# Patient Record
Sex: Female | Born: 1945 | Race: White | Hispanic: No | Marital: Married | Smoking: Never smoker
Health system: Southern US, Community
[De-identification: ages and names within clinical notes are randomized; demographics above are authoritative.]

## PROBLEM LIST (undated history)

## (undated) DIAGNOSIS — F32A Depression, unspecified: Secondary | ICD-10-CM

## (undated) DIAGNOSIS — F0390 Unspecified dementia without behavioral disturbance: Secondary | ICD-10-CM

## (undated) DIAGNOSIS — F329 Major depressive disorder, single episode, unspecified: Secondary | ICD-10-CM

## (undated) DIAGNOSIS — F03A0 Unspecified dementia, mild, without behavioral disturbance, psychotic disturbance, mood disturbance, and anxiety: Secondary | ICD-10-CM

## (undated) HISTORY — DX: Major depressive disorder, single episode, unspecified: F32.9

## (undated) HISTORY — DX: Unspecified dementia without behavioral disturbance: F03.90

## (undated) HISTORY — DX: Unspecified dementia, mild, without behavioral disturbance, psychotic disturbance, mood disturbance, and anxiety: F03.A0

## (undated) HISTORY — DX: Depression, unspecified: F32.A

## (undated) HISTORY — PX: OTHER SURGICAL HISTORY: SHX169

---

## 2009-10-01 ENCOUNTER — Emergency Department: Payer: Self-pay | Admitting: Emergency Medicine

## 2010-01-27 ENCOUNTER — Ambulatory Visit (HOSPITAL_COMMUNITY): Admission: RE | Admit: 2010-01-27 | Discharge: 2010-01-27 | Payer: Self-pay | Admitting: Family Medicine

## 2012-07-26 ENCOUNTER — Encounter: Payer: Self-pay | Admitting: Gastroenterology

## 2012-07-26 ENCOUNTER — Ambulatory Visit (INDEPENDENT_AMBULATORY_CARE_PROVIDER_SITE_OTHER): Payer: Medicare Other | Admitting: Gastroenterology

## 2012-07-26 ENCOUNTER — Other Ambulatory Visit: Payer: Self-pay

## 2012-07-26 VITALS — BP 100/60 | HR 62 | Temp 98.3°F | Ht 60.0 in | Wt 147.4 lb

## 2012-07-26 DIAGNOSIS — R1310 Dysphagia, unspecified: Secondary | ICD-10-CM

## 2012-07-26 DIAGNOSIS — R160 Hepatomegaly, not elsewhere classified: Secondary | ICD-10-CM

## 2012-07-26 NOTE — Assessment & Plan Note (Signed)
On exam, question hepatomegaly. Proceed with [place].

## 2012-07-26 NOTE — Progress Notes (Signed)
Primary Care Physician:  Rush Barer, PA Primary Gastroenterologist:  Dr. Darrick Penna   Chief Complaint  Patient presents with  . Dysphagia    HPI:   66 year old female who presents with dysphagia. Hx of mild dementia. Pleasantly confused, husband present. States year is 1930 but is oriented to person, situation, place. Reports 1-2 years of issues eating in public, ok at home. Notes choking at restaurants, vomiting. Husband has never seen her get choked at home. Solid food issue. +odynophagia at times. Chronic coughing. Started on Omeprazole yesterday. No nausea, no lack of appetite. No abdominal pain. No melena, no hematochezia. No prior colonoscopy. No change in bowel habits. Points to right below suprasternal notch.   Past Medical History  Diagnosis Date  . Depression   . Mild dementia     ? Alzheimer's    Past Surgical History  Procedure Date  . None     Current Outpatient Prescriptions  Medication Sig Dispense Refill  . azithromycin (ZITHROMAX) 250 MG tablet       . chlorpheniramine-HYDROcodone (TUSSIONEX) 10-8 MG/5ML LQCR Take 5 mLs by mouth every 12 (twelve) hours.       . citalopram (CELEXA) 10 MG tablet Take 10 mg by mouth daily.       Marland Kitchen donepezil (ARICEPT) 10 MG tablet Take 10 mg by mouth at bedtime as needed.       Marland Kitchen omeprazole (PRILOSEC) 20 MG capsule Take 20 mg by mouth daily.       . predniSONE (DELTASONE) 10 MG tablet Take 10 mg by mouth daily.       . vitamin B-12 (CYANOCOBALAMIN) 1000 MCG tablet Take 1,000 mcg by mouth 2 (two) times daily.        Allergies as of 07/26/2012  . (No Known Allergies)    Family History  Problem Relation Age of Onset  . Colon cancer Neg Hx     History   Social History  . Marital Status: Married    Spouse Name: N/A    Number of Children: N/A  . Years of Education: N/A   Occupational History  . retired     Production designer, theatre/television/film at [employer]   Social History Main Topics  . Smoking status: Never Smoker   . Smokeless tobacco:  Not on file  . Alcohol Use: No  . Drug Use: No  . Sexually Active: Not on file   Other Topics Concern  . Not on file   Social History Narrative  . No narrative on file    Review of Systems: Gen: Denies any fever, chills, fatigue, weight loss, lack of appetite.  CV: Denies chest pain, heart palpitations, peripheral edema, syncope.  Resp: Denies shortness of breath at rest or with exertion. Denies wheezing or cough.  GI: SEE HPI GU : Denies urinary burning, urinary frequency, urinary hesitancy MS: Denies joint pain, muscle weakness, cramps, or limitation of movement.  Derm: Denies rash, itching, dry skin Psych: Denies depression, anxiety, memory loss, and confusion Heme: Denies bruising, bleeding, and enlarged lymph nodes.  Physical Exam: BP 100/60  Pulse 62  Temp 98.3 F (36.8 C) (Temporal)  Ht 5' (1.524 m)  Wt 147 lb 6.4 oz (66.86 kg)  BMI 28.79 kg/m2 General:   Alert and oriented to person, place, situation. Not time. Pleasant and cooperative. Well-nourished and well-developed.  Head:  Normocephalic and atraumatic. Eyes:  Without icterus, sclera clear and conjunctiva pink.  Ears:  Normal auditory acuity. Nose:  No deformity, discharge,  or lesions. Mouth:  No  deformity or lesions, oral mucosa pink.  Neck:  Supple, without mass or thyromegaly. Lungs:  Scattered rhonchi, mild expiratory wheeze bilaterally, diminished bases Heart:  S1, S2 present without murmurs appreciated.  Abdomen:  +BS, soft, non-tender and non-distended. Possible hepatomegaly noted. No guarding or rebound. No masses appreciated.  Rectal:  Deferred  Msk:  Symmetrical without gross deformities. Normal posture. Extremities:  Without clubbing or edema. Neurologic:  Alert and  oriented to person, place, situation;  grossly normal neurologically. Skin:  Intact without significant lesions or rashes. Cervical Nodes:  No significant cervical adenopathy. Psych:  Alert and cooperative. Normal mood and  affect.

## 2012-07-26 NOTE — Patient Instructions (Addendum)
We have set you up for an ultrasound of your belly. We will call you with the results.  We have also set you up for an upper endoscopy with Dr. Darrick Penna. Continue to take Prilosec daily.

## 2012-07-26 NOTE — Assessment & Plan Note (Signed)
66 year old female with several year hx of choking spells ONLY in public. Pt points to right below suprasternal notch, stating food "sticks" in this region. +odynophagia at times. Symptoms not completely representing classic dysphagia, and it appears she may be dealing with globus sensation. Question anxiety playing large role. However, needs upper GI evaluation to evaluate for any occult issues. Continue Prilosec as ordered.   Proceed with upper endoscopy and possible dilation in the near future with Dr. Darrick Penna. The risks, benefits, and alternatives have been discussed in detail with patient. They have stated understanding and desire to proceed.  No prior TCS. Pt declines pursuing at this time, despite my discussion regarding the importance of screening for colon cancer. She would rather assess upper GI symptoms first.

## 2012-07-26 NOTE — Progress Notes (Signed)
Pt is set up for [place] on Sept. 26 at 7:45 and she is aware not to eat or drink after mid-night.

## 2012-07-27 NOTE — Progress Notes (Signed)
Faxed to PCP

## 2012-08-02 ENCOUNTER — Ambulatory Visit (HOSPITAL_COMMUNITY)
Admission: RE | Admit: 2012-08-02 | Discharge: 2012-08-02 | Disposition: A | Discharge status: Home / Self Care | Payer: Medicare Other | Source: Ambulatory Visit | Attending: Gastroenterology | Admitting: Gastroenterology

## 2012-08-02 DIAGNOSIS — R932 Abnormal findings on diagnostic imaging of liver and biliary tract: Secondary | ICD-10-CM | POA: Insufficient documentation

## 2012-08-02 DIAGNOSIS — R160 Hepatomegaly, not elsewhere classified: Secondary | ICD-10-CM | POA: Insufficient documentation

## 2012-08-02 NOTE — Progress Notes (Signed)
Quick Note:  [place] without hepatomegaly.  Please let pt know. ______

## 2012-08-03 NOTE — Progress Notes (Signed)
Quick Note:  Called. Many rings and no answer. ______ 

## 2012-08-06 NOTE — Progress Notes (Signed)
Quick Note:  Called and informed pt's husband. ______

## 2012-08-10 ENCOUNTER — Encounter (HOSPITAL_COMMUNITY): Payer: Self-pay | Admitting: *Deleted

## 2012-08-10 ENCOUNTER — Encounter (HOSPITAL_COMMUNITY)
Admission: RE | Disposition: A | Discharge status: Home / Self Care | Payer: Self-pay | Source: Ambulatory Visit | Attending: Gastroenterology

## 2012-08-10 ENCOUNTER — Ambulatory Visit (HOSPITAL_COMMUNITY)
Admission: RE | Admit: 2012-08-10 | Discharge: 2012-08-10 | Disposition: A | Discharge status: Home / Self Care | Payer: Medicare Other | Source: Ambulatory Visit | Attending: Gastroenterology | Admitting: Gastroenterology

## 2012-08-10 DIAGNOSIS — K222 Esophageal obstruction: Secondary | ICD-10-CM

## 2012-08-10 DIAGNOSIS — R1310 Dysphagia, unspecified: Secondary | ICD-10-CM | POA: Insufficient documentation

## 2012-08-10 DIAGNOSIS — K2940 Chronic atrophic gastritis without bleeding: Secondary | ICD-10-CM | POA: Insufficient documentation

## 2012-08-10 DIAGNOSIS — K2970 Gastritis, unspecified, without bleeding: Secondary | ICD-10-CM

## 2012-08-10 DIAGNOSIS — K2990 Gastroduodenitis, unspecified, without bleeding: Secondary | ICD-10-CM

## 2012-08-10 DIAGNOSIS — K2980 Duodenitis without bleeding: Secondary | ICD-10-CM | POA: Insufficient documentation

## 2012-08-10 HISTORY — PX: SAVORY DILATION: SHX5439

## 2012-08-10 HISTORY — PX: ESOPHAGOGASTRODUODENOSCOPY: SHX5428

## 2012-08-10 HISTORY — PX: MALONEY DILATION: SHX5535

## 2012-08-10 SURGERY — EGD (ESOPHAGOGASTRODUODENOSCOPY)
Anesthesia: Moderate Sedation

## 2012-08-10 MED ORDER — MIDAZOLAM HCL 5 MG/5ML IJ SOLN
INTRAMUSCULAR | Status: DC | PRN
  Administered 2012-08-10: 1 mg via INTRAVENOUS
  Administered 2012-08-10 (×2): 2 mg via INTRAVENOUS

## 2012-08-10 MED ORDER — SODIUM CHLORIDE 0.9 % IJ SOLN
INTRAMUSCULAR | Status: AC
  Filled 2012-08-10: qty 10

## 2012-08-10 MED ORDER — MEPERIDINE HCL 100 MG/ML IJ SOLN
INTRAMUSCULAR | Status: DC | PRN
  Administered 2012-08-10: 25 mg via INTRAVENOUS

## 2012-08-10 MED ORDER — SODIUM CHLORIDE 0.45 % IV SOLN
INTRAVENOUS | Status: DC
  Administered 2012-08-10: 11:00:00 via INTRAVENOUS

## 2012-08-10 MED ORDER — MEPERIDINE HCL 100 MG/ML IJ SOLN
INTRAMUSCULAR | Status: AC
  Filled 2012-08-10: qty 2

## 2012-08-10 MED ORDER — PROMETHAZINE HCL 25 MG/ML IJ SOLN
INTRAMUSCULAR | Status: AC
  Filled 2012-08-10: qty 1

## 2012-08-10 MED ORDER — MINERAL OIL PO OIL
TOPICAL_OIL | ORAL | Status: AC
  Filled 2012-08-10: qty 30

## 2012-08-10 MED ORDER — MIDAZOLAM HCL 5 MG/5ML IJ SOLN
INTRAMUSCULAR | Status: AC
  Filled 2012-08-10: qty 10

## 2012-08-10 MED ORDER — BUTAMBEN-TETRACAINE-BENZOCAINE 2-2-14 % EX AERO
INHALATION_SPRAY | CUTANEOUS | Status: DC | PRN
  Administered 2012-08-10: 2 via TOPICAL

## 2012-08-10 MED ORDER — STERILE WATER FOR IRRIGATION IR SOLN
Status: DC | PRN
  Administered 2012-08-10: 12:00:00

## 2012-08-10 NOTE — H&P (Signed)
  Primary Care Physician:  Rush Barer, PA Primary Gastroenterologist:  Dr. Darrick Penna  Pre-Procedure History & Physical: HPI:  Anna Moon is a 66 y.o. female here for DYSPHAGIA.   Past Medical History  Diagnosis Date  . Depression   . Mild dementia     ? Alzheimer's    Past Surgical History  Procedure Date  . None     Prior to Admission medications   Medication Sig Start Date End Date Taking? Authorizing Provider  azithromycin (ZITHROMAX) 250 MG tablet  07/19/12  Yes Historical Provider, MD  chlorpheniramine-HYDROcodone (TUSSIONEX) 10-8 MG/5ML LQCR Take 5 mLs by mouth every 12 (twelve) hours.  07/19/12  Yes Historical Provider, MD  citalopram (CELEXA) 10 MG tablet Take 10 mg by mouth daily.  07/10/12  Yes Historical Provider, MD  donepezil (ARICEPT) 10 MG tablet Take 10 mg by mouth at bedtime as needed.  05/15/12  Yes Historical Provider, MD  omeprazole (PRILOSEC) 20 MG capsule Take 20 mg by mouth daily.  07/24/12  Yes Historical Provider, MD  predniSONE (DELTASONE) 10 MG tablet Take 10 mg by mouth daily.  07/19/12  Yes Historical Provider, MD  vitamin B-12 (CYANOCOBALAMIN) 1000 MCG tablet Take 1,000 mcg by mouth 2 (two) times daily.   Yes Historical Provider, MD    Allergies as of 07/26/2012  . (No Known Allergies)    Family History  Problem Relation Age of Onset  . Colon cancer Neg Hx     History   Social History  . Marital Status: Married    Spouse Name: N/A    Number of Children: N/A  . Years of Education: N/A   Occupational History  . retired     Production designer, theatre/television/film at [employer]   Social History Main Topics  . Smoking status: Never Smoker   . Smokeless tobacco: Not on file  . Alcohol Use: No  . Drug Use: No  . Sexually Active: Not on file   Other Topics Concern  . Not on file   Social History Narrative  . No narrative on file    Review of Systems: See HPI, otherwise negative ROS   Physical Exam: BP 111/64  Pulse 58  Temp 97.7 F (36.5 C) (Oral)   Resp 10  SpO2 98% General:   Alert,  pleasant and cooperative in NAD Head:  Normocephalic and atraumatic. Neck:  Supple; Lungs:  Clear throughout to auscultation.    Heart:  Regular rate and rhythm. Abdomen:  Soft, nontender and nondistended. Normal bowel sounds, without guarding, and without rebound.   Neurologic:  Alert and  oriented x4;  grossly normal neurologically.  Impression/Plan:     DYSPHAGIA  PLAN:  EGD/DIL TODAY

## 2012-08-10 NOTE — Op Note (Signed)
[facility] [address] [place], [address]   ENDOSCOPY PROCEDURE REPORT  PATIENT: Anna Moon, Anna Moon  MR#: [id number] BIRTHDATE: 1946-09-04 , 66  yrs. old GENDER: Female  ENDOSCOPIST: Jonette Eva, MD REFFERED MW:UXLK Claggett, PA-C  PROCEDURE DATE:  08/10/2012 PROCEDURE:   EGD with biopsy and EGD with dilatation over guidewire   INDICATIONS:1.  dysphagia. MEDICATIONS: Demerol 25 mg IV and Versed 5 mg IV TOPICAL ANESTHETIC: Cetacaine Spray  DESCRIPTION OF PROCEDURE:   After the risks benefits and alternatives of the procedure were thoroughly explained, informed consent was obtained.  The EG-[id number] ([id number])  endoscope was introduced through the mouth and advanced to the second portion of the duodenum.  The instrument was slowly withdrawn as the mucosa was carefully examined.  Prior to withdrawal of the scope, the guidwire was placed.  The esophagus was dilated successfully.  The patient was recovered in endoscopy and discharged home in satisfactory condition.      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  The stenosis was traversable with the endoscope.  STOMACH: Non-erosive gastritis (inflammation) was found.  Multiple biopsies were performed using cold forceps.  DUODENUM: Mild duodenal inflammation was found.   Dilation was then performed at the proximal esophagus  Dilator: Savary over guidewire Size(s): 12.8-16 mm Resistance: minimal to moderate  COMPLICATIONS: There were no complications.   ENDOSCOPIC IMPRESSION: 1.   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   Non-erosive gastritis (inflammation) was found; multiple biopsies 3.   Duodenal inflammation was found  RECOMMENDATIONS: CONTINUE OMEPRAZOLE.  TAKE 30 MINUTES PRIOR TO HER FIRST MEAL.  FOLLOW A LOW FAT DIET.  BIOPSY WILL BE BACK IN 7 DAYS.  FOLLOW UP IN 3 MOS.      _______________________________ Rosalie DoctorJonette Eva, MD  08/10/2012 2:58 PM      PATIENT NAME:  Anna Moon, Anna Moon MR#: [id number]

## 2012-08-15 ENCOUNTER — Encounter (HOSPITAL_COMMUNITY): Payer: Self-pay | Admitting: Gastroenterology

## 2012-08-16 ENCOUNTER — Telehealth: Payer: Self-pay | Admitting: Gastroenterology

## 2012-08-16 NOTE — Telephone Encounter (Signed)
Called. Many rings and no answer.  

## 2012-08-16 NOTE — Telephone Encounter (Signed)
Faxed to PCP, recall made  

## 2012-08-16 NOTE — Telephone Encounter (Signed)
Please call pt. HER stomach Bx shows mild gastritis. CONTINUE OMEPRAZOLE. OPV IN 3 MOS E  30 SLF DYSPHAGIA.

## 2012-08-20 NOTE — Telephone Encounter (Signed)
Called and informed pt.  

## 2012-09-15 NOTE — Progress Notes (Signed)
REVIEWED.  EGD/DIL PCT 2013. OPV JAN 2014.

## 2012-10-17 ENCOUNTER — Encounter: Payer: Self-pay | Admitting: *Deleted

## 2014-09-04 ENCOUNTER — Encounter: Payer: Self-pay | Admitting: Gastroenterology

## 2014-09-04 ENCOUNTER — Ambulatory Visit (INDEPENDENT_AMBULATORY_CARE_PROVIDER_SITE_OTHER): Payer: Medicare Other | Admitting: Gastroenterology

## 2014-09-04 VITALS — BP 116/70 | HR 63 | Temp 95.6°F | Ht 60.0 in | Wt 143.0 lb

## 2014-09-04 DIAGNOSIS — R1310 Dysphagia, unspecified: Secondary | ICD-10-CM

## 2014-09-04 NOTE — Patient Instructions (Signed)
1. Xray of your esophagus as scheduled. We will call you with results once available.

## 2014-09-04 NOTE — Assessment & Plan Note (Addendum)
Ongoing dysphagia most noted in certain situations that invoke stress or anxiety. Previously had a stricture noted in the upper third of the esophagus noted on EGD in October 2013. Husband and patient believes dilation did not help much. She has a fear of needles. Discussed options of EGD versus barium esophagram. Because of her fear of needles they've requested esophagram. If evidence of significant stricture then EGD will be planned accordingly. They're still not interested in pursuing colonoscopy.  Discussed swallowing precautions.

## 2014-09-04 NOTE — Progress Notes (Signed)
cc'ed to pcp °

## 2014-09-04 NOTE — Progress Notes (Signed)
Primary Care Physician:  Alleen BorneLAGGETT,ELIN, PA-C  Primary Gastroenterologist:  Jonette EvaSandi Fields, MD   Chief Complaint  Patient presents with  . Dysphagia    HPI:  Anna Moon is a 68 y.o. female here for further evaluation of dysphagia. She has a history of mild dementia. She comes in with her husband today. Complains of difficulty swallowing. Describes getting a bubble stop in her chest and has to have the Heimlich maneuver performed to relieve the discomfort. Happens with greasy foods, bread, meat. Seems to be worse when she is anxious or nervous. According to her husband, last EGD with dilation did not seem to help much. She was found to have a stricture in the upper third of the esophagus, the stenosis was traversed with the endoscope, savory guidewire was used 12.8-16 mm. Patient is very fearful of needles and does not want to undergo another endoscopy she has to. With regards to bowel function, denies constipation, diarrhea, weight loss, melena, rectal bleeding. She also denies heartburn.   She is supposed to be on the medications listed below but has not taken him in 2 months. Husband states she he didn't believe they were helping.   Current Outpatient Prescriptions  Medication Sig Dispense Refill  . citalopram (CELEXA) 10 MG tablet Take 10 mg by mouth daily.       Marland Kitchen. donepezil (ARICEPT) 10 MG tablet Take 10 mg by mouth at bedtime as needed.       Marland Kitchen. omeprazole (PRILOSEC) 20 MG capsule Take 20 mg by mouth daily.        No current facility-administered medications for this visit.    Allergies as of 09/04/2014  . (No Known Allergies)    Past Medical History  Diagnosis Date  . Depression   . Mild dementia     ? Alzheimer's    Past Surgical History  Procedure Laterality Date  . None    . 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  minimal gastritis. no H.pylori  . Maloney dilation  08/10/2012    Procedure: MALONEY DILATION;  Surgeon: West BaliSandi L Fields, MD;  [facility];  Service: Endoscopy;  Laterality: N/A;  . Savory dilation  08/10/2012    Procedure: SAVORY DILATION;  Surgeon: West BaliSandi L Fields, MD;  Location: [facility];  Service: Endoscopy;  Laterality: N/A;    Family History  Problem Relation Age of Onset  . Colon cancer Neg Hx     History   Social History  . Marital Status: Married    Spouse Name: N/A    Number of Children: N/A  . Years of Education: N/A   Occupational History  . 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 Smoking status: Never Smoker   . Smokeless tobacco: Not on file  . Alcohol Use: No  . Drug Use: No  . Sexual Activity: Not on file   Other Topics Concern  . Not on file   Social History Narrative  . No narrative on file      ROS:  General: Negative for anorexia, weight loss, fever, chills, fatigue, weakness. Eyes: Negative for vision changes.  ENT: Negative for hoarseness, nasal congestion. CV: Negative for chest pain, angina, palpitations, dyspnea on exertion, peripheral edema.  Respiratory: Negative for dyspnea at rest, dyspnea on exertion, cough, sputum, wheezing.  GI: See  history of present illness. GU:  Negative for dysuria, hematuria, urinary incontinence, urinary frequency, nocturnal urination.  MS: Negative for joint pain, low back pain.  Derm: Negative for rash or itching.  Neuro: Negative for weakness, abnormal sensation, seizure, frequent headaches, +memory loss.  Psych: Negative for anxiety, depression, suicidal ideation, hallucinations.  Endo: Negative for unusual weight change.  Heme: Negative for bruising or bleeding. Allergy: Negative for rash or hives.    Physical Examination:  BP 116/70  Pulse 63  Temp(Src) 95.6 F (35.3 C) (Oral)  Ht 5' (1.524 m)  Wt 143 lb (64.864 kg)  BMI 27.93 kg/m2   General:  Well-nourished, well-developed in no acute distress.  Head: Normocephalic, atraumatic.   Eyes: Conjunctiva pink, no icterus. Mouth: Oropharyngeal mucosa moist and pink , no lesions erythema or exudate. Neck: Supple without thyromegaly, masses, or lymphadenopathy.  Lungs: Clear to auscultation bilaterally.  Heart: Regular rate and rhythm, no murmurs rubs or gallops.  Abdomen: Bowel sounds are normal, nontender, nondistended, no hepatosplenomegaly or masses, no abdominal bruits or    hernia , no rebound or guarding.   Rectal: not performed Extremities: No lower extremity edema. No clubbing or deformities.  Neuro: Alert and oriented x 4 , grossly normal neurologically.  Skin: Warm and dry, no rash or jaundice.   Psych: Alert and cooperative, normal mood and affect.

## 2014-09-10 ENCOUNTER — Ambulatory Visit (HOSPITAL_COMMUNITY)
Admission: RE | Admit: 2014-09-10 | Discharge: 2014-09-10 | Disposition: A | Discharge status: Home / Self Care | Payer: Medicare Other | Source: Ambulatory Visit | Attending: Gastroenterology | Admitting: Gastroenterology

## 2014-09-10 DIAGNOSIS — K222 Esophageal obstruction: Secondary | ICD-10-CM | POA: Diagnosis not present

## 2014-09-10 DIAGNOSIS — R1310 Dysphagia, unspecified: Secondary | ICD-10-CM | POA: Insufficient documentation

## 2014-09-10 NOTE — Progress Notes (Signed)
Quick Note:  Please let patient's husband know (patient has dementia) that she has irregular distal esophageal stricture and needs to have EGD+/-ED with SLF.  Please schedule. ______

## 2014-09-11 NOTE — Progress Notes (Signed)
Quick Note:  PT's husband is aware and it is OK to schedule. ______

## 2014-09-11 NOTE — Progress Notes (Signed)
Quick Note:  LMOM for husband, Deniece PortelaWayne, to call. ______

## 2014-09-22 ENCOUNTER — Other Ambulatory Visit: Payer: Self-pay

## 2014-09-22 DIAGNOSIS — T18108A Unspecified foreign body in esophagus causing other injury, initial encounter: Secondary | ICD-10-CM

## 2014-10-20 ENCOUNTER — Ambulatory Visit (HOSPITAL_COMMUNITY): Admission: RE | Admit: 2014-10-20 | Payer: Medicare Other | Source: Ambulatory Visit | Admitting: Gastroenterology

## 2014-10-20 ENCOUNTER — Telehealth: Payer: Self-pay

## 2014-10-20 ENCOUNTER — Other Ambulatory Visit: Payer: Self-pay

## 2014-10-20 ENCOUNTER — Encounter (HOSPITAL_COMMUNITY): Admission: RE | Payer: Self-pay | Source: Ambulatory Visit

## 2014-10-20 DIAGNOSIS — R1314 Dysphagia, pharyngoesophageal phase: Secondary | ICD-10-CM

## 2014-10-20 SURGERY — EGD (ESOPHAGOGASTRODUODENOSCOPY)
Anesthesia: Moderate Sedation

## 2014-10-20 NOTE — Progress Notes (Signed)
REVIEWED.  

## 2014-10-20 NOTE — OR Nursing (Signed)
Patient did not show up for EGD/ED. Called and spoke with patient's husband who said that he thought the procedure was on 12/16. Candy SwazilandJordan, LPN at Dr. Darrick PennaFields office notified to reschedule patient's procedure.

## 2014-10-20 NOTE — H&P (Deleted)
  Primary Care Physician:  Alleen BorneLAGGETT,ELIN, PA-C Primary Gastroenterologist:  Dr. Darrick PennaFields  Pre-Procedure History & Physical: HPI:  Anna Moon is a 68 y.o. female here for DYSPHAGIA.  Past Medical History  Diagnosis Date  . Depression   . Mild dementia     ? Alzheimer's    Past Surgical History  Procedure Laterality Date  . None    . 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 minimal gastritis. no H.pylori  . Maloney dilation  08/10/2012    Procedure: MALONEY DILATION;  Surgeon: West BaliSandi L Pope Brunty, MD;  [facility];  Service: Endoscopy;  Laterality: N/A;  . Savory dilation  08/10/2012    Procedure: SAVORY DILATION;  Surgeon: West BaliSandi L Braylyn Eye, MD;  [facility]: [facility];  Service: Endoscopy;  Laterality: N/A;    Prior to Admission medications   Medication Sig Start Date End Date Taking? Authorizing Provider  citalopram (CELEXA) 10 MG tablet Take 10 mg by mouth daily.  07/10/12  Yes Historical Provider, MD  donepezil (ARICEPT) 10 MG tablet Take 10 mg by mouth at bedtime.  05/15/12  Yes Historical Provider, MD  omeprazole (PRILOSEC) 20 MG capsule Take 20 mg by mouth daily.  07/24/12  Yes Historical Provider, MD    Allergies as of 09/22/2014  . (No Known Allergies)    Family History  Problem Relation Age of Onset  . Colon cancer Neg Hx     History   Social History  . Marital Status: Married    Spouse Name: N/A    Number of Children: N/A  . Years of Education: N/A   Occupational History  . 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 Smoking status: Never Smoker   . Smokeless tobacco: Not on file  . Alcohol Use: No  . Drug Use: No  . Sexual Activity: Not on file   Other Topics Concern  . Not on file   Social History Narrative    Review of Systems: See HPI, otherwise negative  ROS   Physical Exam: There were no vitals taken for this visit. General:   Alert,  pleasant and cooperative in NAD Head:  Normocephalic and atraumatic. Neck:  Supple; Lungs:  Clear throughout to auscultation.    Heart:  Regular rate and rhythm. Abdomen:  Soft, nontender and nondistended. Normal bowel sounds, without guarding, and without rebound.   Neurologic:  Alert and  oriented x4;  grossly normal neurologically.  Impression/Plan:   DYSPHAGIA  PLAN:  EGD/DIL TODAY

## 2014-10-22 NOTE — Telephone Encounter (Signed)
REVIEWED-NO ADDITIONAL RECOMMENDATIONS. 

## 2014-11-10 ENCOUNTER — Encounter (HOSPITAL_COMMUNITY)
Admission: RE | Disposition: A | Discharge status: Home / Self Care | Payer: Self-pay | Source: Ambulatory Visit | Attending: Gastroenterology

## 2014-11-10 ENCOUNTER — Ambulatory Visit (HOSPITAL_COMMUNITY)
Admission: RE | Admit: 2014-11-10 | Discharge: 2014-11-10 | Disposition: A | Discharge status: Home / Self Care | Payer: Medicare Other | Source: Ambulatory Visit | Attending: Gastroenterology | Admitting: Gastroenterology

## 2014-11-10 ENCOUNTER — Encounter (HOSPITAL_COMMUNITY): Payer: Self-pay | Admitting: *Deleted

## 2014-11-10 DIAGNOSIS — K222 Esophageal obstruction: Secondary | ICD-10-CM | POA: Diagnosis not present

## 2014-11-10 DIAGNOSIS — K259 Gastric ulcer, unspecified as acute or chronic, without hemorrhage or perforation: Secondary | ICD-10-CM

## 2014-11-10 DIAGNOSIS — R1013 Epigastric pain: Secondary | ICD-10-CM | POA: Diagnosis present

## 2014-11-10 DIAGNOSIS — F329 Major depressive disorder, single episode, unspecified: Secondary | ICD-10-CM | POA: Insufficient documentation

## 2014-11-10 DIAGNOSIS — K317 Polyp of stomach and duodenum: Secondary | ICD-10-CM | POA: Diagnosis not present

## 2014-11-10 DIAGNOSIS — K449 Diaphragmatic hernia without obstruction or gangrene: Secondary | ICD-10-CM | POA: Insufficient documentation

## 2014-11-10 DIAGNOSIS — F0390 Unspecified dementia without behavioral disturbance: Secondary | ICD-10-CM | POA: Diagnosis not present

## 2014-11-10 DIAGNOSIS — R1314 Dysphagia, pharyngoesophageal phase: Secondary | ICD-10-CM

## 2014-11-10 DIAGNOSIS — K209 Esophagitis, unspecified: Secondary | ICD-10-CM | POA: Diagnosis not present

## 2014-11-10 HISTORY — PX: ESOPHAGOGASTRODUODENOSCOPY: SHX5428

## 2014-11-10 HISTORY — PX: SAVORY DILATION: SHX5439

## 2014-11-10 HISTORY — PX: MALONEY DILATION: SHX5535

## 2014-11-10 SURGERY — EGD (ESOPHAGOGASTRODUODENOSCOPY)
Anesthesia: Moderate Sedation

## 2014-11-10 MED ORDER — OMEPRAZOLE 20 MG PO CPDR: DELAYED_RELEASE_CAPSULE | ORAL | Status: DC

## 2014-11-10 MED ORDER — MEPERIDINE HCL 100 MG/ML IJ SOLN
INTRAMUSCULAR | Status: DC | PRN
  Administered 2014-11-10 (×2): 25 mg via INTRAVENOUS

## 2014-11-10 MED ORDER — SODIUM CHLORIDE 0.9 % IV SOLN
INTRAVENOUS | Status: DC
  Administered 2014-11-10: 10:00:00 via INTRAVENOUS

## 2014-11-10 MED ORDER — MEPERIDINE HCL 100 MG/ML IJ SOLN
INTRAMUSCULAR | Status: AC
  Filled 2014-11-10: qty 2

## 2014-11-10 MED ORDER — MIDAZOLAM HCL 5 MG/5ML IJ SOLN
INTRAMUSCULAR | Status: DC | PRN
  Administered 2014-11-10 (×2): 1 mg via INTRAVENOUS
  Administered 2014-11-10: 2 mg via INTRAVENOUS

## 2014-11-10 MED ORDER — LIDOCAINE VISCOUS 2 % MT SOLN
OROMUCOSAL | Status: DC | PRN
  Administered 2014-11-10: 1 via OROMUCOSAL

## 2014-11-10 MED ORDER — ONDANSETRON 4 MG PO TBDP
ORAL_TABLET | ORAL | Status: AC
  Filled 2014-11-10: qty 1

## 2014-11-10 MED ORDER — MIDAZOLAM HCL 5 MG/5ML IJ SOLN
INTRAMUSCULAR | Status: AC
  Filled 2014-11-10: qty 10

## 2014-11-10 MED ORDER — ONDANSETRON 4 MG PO TBDP
4.0000 mg | ORAL_TABLET | Freq: Once | ORAL | Status: AC
  Administered 2014-11-10: 4 mg via ORAL

## 2014-11-10 NOTE — Progress Notes (Signed)
Pt with brown red flecked emesis postop approximately 50ccs.  Dr. Darrick Penna notified, order received.

## 2014-11-10 NOTE — Progress Notes (Signed)
Pt "feeling better".  No further emesis

## 2014-11-10 NOTE — H&P (Signed)
  Primary Care Physician:  Alleen Borne Primary Gastroenterologist:  Dr. Darrick Penna  Pre-Procedure History & Physical: HPI:  Anna Moon is a 69 y.o. female here for DYSPEPSIA./DYSPHAGIA-ABNL BPE NOV 2015.  Past Medical History  Diagnosis Date  . Depression   . Mild dementia     ? Alzheimer's    Past Surgical History  Procedure Laterality Date  . None    . Esophagogastroduodenoscopy  08/10/2012    [id number] was found in the upper third of the esophagus Non-erosive gastritis (inflammation) was found; multiple bx Duodenal inflammation was found. minimal gastritis. no H.pylori  . Maloney dilation  08/10/2012    Procedure: MALONEY DILATION;  Surgeon: West Bali, MD;  [facility];  Service: Endoscopy;  Laterality: N/A;  . Savory dilation  08/10/2012    Procedure: SAVORY DILATION;  Surgeon: West Bali, MD;  [facility];  Service: Endoscopy;  Laterality: N/A;    Prior to Admission medications   Medication Sig Start Date End Date Taking? Authorizing Provider  omeprazole (PRILOSEC) 20 MG capsule Take 20 mg by mouth daily as needed (heart burn).  07/24/12   Historical Provider, MD    Allergies as of 10/20/2014  . (No Known Allergies)    Family History  Problem Relation Age of Onset  . Colon cancer Neg Hx     History   Social History  . Marital Status: Married    Spouse Name: N/A    Number of Children: N/A  . Years of Education: N/A   Occupational History  . retired     Production designer, theatre/television/film at [employer]   Social History Main Topics  . Smoking status: Never Smoker   . Smokeless tobacco: Not on file  . Alcohol Use: No  . Drug Use: No  . Sexual Activity: Not on file   Other Topics Concern  . Not on file   Social History Narrative    Review of Systems: See HPI, otherwise negative ROS   Physical Exam: BP 131/83 mmHg  Pulse 80  Temp(Src) 98.1 F (36.7 C) (Oral)  Resp 18  Ht 5' (1.524 m)  Wt 143 lb (64.864 kg)  BMI 27.93  kg/m2  SpO2 98% General:   Alert,  pleasant and cooperative in NAD Head:  Normocephalic and atraumatic. Neck:  Supple; Lungs:  Clear throughout to auscultation.    Heart:  Regular rate and rhythm. Abdomen:  Soft, nontender and nondistended. Normal bowel sounds, without guarding, and without rebound.   Neurologic:  Alert and  oriented x4;  grossly normal neurologically.  Impression/Plan:    DYSPEPSIA./DYSPHAGIA  PLAN:  EGD/?DIL TODAY

## 2014-11-10 NOTE — Discharge Instructions (Signed)
I STRETCHED HER esophagus. 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 SHE HAS ESOPHAGITIS DUE TO ACID REFLUX. SHE HAS mild gastritis & A GASTRIC POLYP. I biopsied HER ESOPHAGUS AND stomach.   SHE NEEDS OMEPRAZOLE.  SHE SHOULD TAKE IT 30 MINUTES PRIOR TO HER MEALS TWICE DAILY FOREVER.  FOLLOW A LOW FAT DIET. FOOD SHOULD BE BAKED, BOILED, OR BROILED.S EE INFO BELOW ON A LOW FAT DIET.   YOUR BIOPSY WILL BE BACK IN 14 DAYS OR YOU CAN LOOK THEM UP ON MY CHART AFTER JAN 7.  FOLLOW UP IN 3 MOS.  UPPER ENDOSCOPY AFTER CARE Read the instructions outlined below and refer to this sheet in the next week. These discharge instructions provide you with general information on caring for yourself after you leave the hospital. While your treatment has been planned according to the most current medical practices available, unavoidable complications occasionally occur. If you have any problems or questions after discharge, call DR. Ryana Montecalvo, [phone].  ACTIVITY  You may resume your regular activity, but move at a slower pace for the next 24 hours.   Take frequent rest periods for the next 24 hours.   Walking will help get rid of the air and reduce the bloated feeling in your belly (abdomen).   No driving for 24 hours (because of the medicine (anesthesia) used during the test).   You may shower.   Do not sign any important legal documents or operate any machinery for 24 hours (because of the anesthesia used during the test).    NUTRITION  Drink plenty of fluids.   You may resume your normal diet as instructed by your doctor.   Begin with a light meal and progress to your normal diet. Heavy or fried foods are harder to digest and may make you feel sick to your stomach (nauseated).   Avoid alcoholic beverages for 24 hours or as instructed.    MEDICATIONS  You may resume your normal medications.   WHAT YOU CAN EXPECT TODAY  Some feelings of bloating in the abdomen.   Passage of  more gas than usual.    IF YOU HAD A BIOPSY TAKEN DURING THE UPPER ENDOSCOPY:  Eat a soft diet IF YOU HAVE NAUSEA, BLOATING, ABDOMINAL PAIN, OR VOMITING.    FINDING OUT THE RESULTS OF YOUR TEST Not all test results are available during your visit. DR. Darrick Penna WILL CALL YOU WITHIN 14 DAYS OF YOUR PROCEDUE WITH YOUR RESULTS. Do not assume everything is normal if you have not heard from DR. Danarius Mcconathy, CALL HER OFFICE AT [phone].  SEEK IMMEDIATE MEDICAL ATTENTION AND CALL THE OFFICE: [phone] IF:  You have more than a spotting of blood in your stool.   Your belly is swollen (abdominal distention).   You are nauseated or vomiting.   You have a temperature over 101F.   You have abdominal pain or discomfort that is severe or gets worse throughout the day.  Gastritis  Gastritis is an inflammation (the body's way of reacting to injury and/or infection) of the stomach. It is often caused by viral or bacterial (germ) infections. It can also be caused BY ASPIRIN, BC/GOODY POWDER'S, (IBUPROFEN) MOTRIN, OR ALEVE (NAPROXEN), chemicals (including alcohol), SPICY FOODS, and medications. This illness may be associated with generalized malaise (feeling tired, not well), UPPER ABDOMINAL STOMACH cramps, and fever. One common bacterial cause of gastritis is an organism known as H. Pylori. This can be treated with antibiotics.   ESOPHAGEAL STRICTURE  Esophageal strictures  can be caused by stomach acid backing up into the tube that carries food from the mouth down to the stomach (lower esophagus).  TREATMENT There are a number of medicines used to treat reflux/stricture, including: Antacids.  Proton-pump inhibitors: OMEPRAZOLE  HOME CARE INSTRUCTIONS Eat 2-3 hours before going to bed.  Try to reach and maintain a healthy weight.  Do not eat just a few very large meals. Instead, eat 4 TO 6 smaller meals throughout the day.  Try to identify foods and beverages that make your symptoms worse, and  avoid these.  Avoid tight clothing.  Do not exercise right after eating.  Low-Fat Diet BREADS, CEREALS, PASTA, RICE, DRIED PEAS, AND BEANS These products are high in carbohydrates and most are low in fat. Therefore, they can be increased in the diet as substitutes for fatty foods. They too, however, contain calories and should not be eaten in excess. Cereals can be eaten for snacks as well as for breakfast.   FRUITS AND VEGETABLES It is good to eat fruits and vegetables. Besides being sources of fiber, both are rich in vitamins and some minerals. They help you get the daily allowances of these nutrients. Fruits and vegetables can be used for snacks and desserts.  MEATS Limit lean meat, chicken, [place], and fish to no more than 6 ounces per day. Beef, Pork, and Lamb Use lean cuts of beef, pork, and lamb. Lean cuts include:  Extra-lean ground beef.  Arm roast.  Sirloin tip.  Center-cut ham.  Round steak.  Loin chops.  Rump roast.  Tenderloin.  Trim all fat off the outside of meats before cooking. It is not necessary to severely decrease the intake of red meat, but lean choices should be made. Lean meat is rich in protein and contains a highly absorbable form of iron. Premenopausal women, in particular, should avoid reducing lean red meat because this could increase the risk for low red blood cells (iron-deficiency anemia).  Chicken and [place] These are good sources of protein. The fat of poultry can be reduced by removing the skin and underlying fat layers before cooking. Chicken and [place] can be substituted for lean red meat in the diet. Poultry should not be fried or covered with high-fat sauces. Fish and Shellfish Fish is a good source of protein. Shellfish contain cholesterol, but they usually are low in saturated fatty acids. The preparation of fish is important. Like chicken and [place], they should not be fried or covered with high-fat sauces. EGGS Egg whites contain no fat or  cholesterol. They can be eaten often. Try 1 to 2 egg whites instead of whole eggs in recipes or use egg substitutes that do not contain yolk. MILK AND DAIRY PRODUCTS Use skim or 1% milk instead of 2% or whole milk. Decrease whole milk, natural, and processed cheeses. Use nonfat or low-fat (2%) cottage cheese or low-fat cheeses made from vegetable oils. Choose nonfat or low-fat (1 to 2%) yogurt. Experiment with evaporated skim milk in recipes that call for heavy cream. Substitute low-fat yogurt or low-fat cottage cheese for sour cream in dips and salad dressings. Have at least 2 servings of low-fat dairy products, such as 2 glasses of skim (or 1%) milk each day to help get your daily calcium intake. FATS AND OILS Reduce the total intake of fats, especially saturated fat. Butterfat, lard, and beef fats are high in saturated fat and cholesterol. These should be avoided as much as possible. Vegetable fats do not contain cholesterol, but certain  vegetable fats, such as coconut oil, palm oil, and palm kernel oil are very high in saturated fats. These should be limited. These fats are often used in bakery goods, processed foods, popcorn, oils, and nondairy creamers. Vegetable shortenings and some peanut butters contain hydrogenated oils, which are also saturated fats. Read the labels on these foods and check for saturated vegetable oils. Unsaturated vegetable oils and fats do not raise blood cholesterol. However, they should be limited because they are fats and are high in calories. Total fat should still be limited to 30% of your daily caloric intake. Desirable liquid vegetable oils are corn oil, cottonseed oil, olive oil, canola oil, safflower oil, soybean oil, and sunflower oil. Peanut oil is not as good, but small amounts are acceptable. Buy a heart-healthy tub margarine that has no partially hydrogenated oils in the ingredients. Mayonnaise and salad dressings often are made from unsaturated fats, but they should  also be limited because of their high calorie and fat content. Seeds, nuts, peanut butter, olives, and avocados are high in fat, but the fat is mainly the unsaturated type. These foods should be limited mainly to avoid excess calories and fat. OTHER EATING TIPS Snacks  Most sweets should be limited as snacks. They tend to be rich in calories and fats, and their caloric content outweighs their nutritional value. Some good choices in snacks are graham crackers, melba toast, soda crackers, bagels (no egg), English muffins, fruits, and vegetables. These snacks are preferable to snack crackers, [place] fries, TORTILLA CHIPS, and POTATO chips. Popcorn should be air-popped or cooked in small amounts of liquid vegetable oil. Desserts Eat fruit, low-fat yogurt, and fruit ices instead of pastries, cake, and cookies. Sherbet, angel food cake, gelatin dessert, frozen low-fat yogurt, or other frozen products that do not contain saturated fat (pure fruit juice bars, frozen ice pops) are also acceptable.  COOKING METHODS Choose those methods that use little or no fat. They include: Poaching.  Braising.  Steaming.  Grilling.  Baking.  Stir-frying.  Broiling.  Microwaving.  Foods can be cooked in a nonstick pan without added fat, or use a nonfat cooking spray in regular cookware. Limit fried foods and avoid frying in saturated fat. Add moisture to lean meats by using water, broth, cooking wines, and other nonfat or low-fat sauces along with the cooking methods mentioned above. Soups and stews should be chilled after cooking. The fat that forms on top after a few hours in the refrigerator should be skimmed off. When preparing meals, avoid using excess salt. Salt can contribute to raising blood pressure in some people.  EATING AWAY FROM HOME Order entres, potatoes, and vegetables without sauces or butter. When meat exceeds the size of a deck of cards (3 to 4 ounces), the rest can be taken home for another  meal. Choose vegetable or fruit salads and ask for low-calorie salad dressings to be served on the side. Use dressings sparingly. Limit high-fat toppings, such as bacon, crumbled eggs, cheese, sunflower seeds, and olives. Ask for heart-healthy tub margarine instead of butter.

## 2014-11-11 NOTE — Op Note (Signed)
[facility] [address] [place], [id number]   ENDOSCOPY PROCEDURE REPORT  PATIENT: Anna CanaryLambert, Anna Moon  MR#: [id number] BIRTHDATE: 29-Dec-1945 , 68  yrs. old GENDER: female  ENDOSCOPIST: Jonette EvaSandi Lea Baine, MD REFERRED XB:JYNWBY:Elin Claggett, PA-C  PROCEDURE DATE: 11/10/2014 PROCEDURE:   EGD w/ biopsy and EGD w/ wire guided (savary) dilation   INDICATIONS:dyspepsia.   dysphagia. MEDICATIONS: Versed 5 mg IV and Demerol 50 mg IV TOPICAL ANESTHETIC:   Viscous Xylocaine ASA CLASS:  DESCRIPTION OF PROCEDURE:     Physical exam was performed.  Informed consent was obtained from the patient after explaining the benefits, risks, and alternatives to the procedure.  The patient was connected to the monitor and placed in the left lateral position.  Continuous oxygen was provided by nasal cannula and IV medicine administered through an indwelling cannula.  After administration of sedation, the patients esophagus was intubated and the EG-[id number] ([id number]  endoscope was advanced under direct visualization to the second portion of the duodenum.  The scope was removed slowly by carefully examining the color, texture, anatomy, and integrity of the mucosa on the way out.  The patient was recovered in endoscopy and discharged home in satisfactory condition.   ESOPHAGUS: Esophagitis with stricture 25-30 cm from the teeth.  GE JXN 30 CM FROM THE TEETH. ESOPHAGUS DILATED WITH SAVARY DILATORS(12.8-16 MM) WITH MINIMAL TO MODERATE RESISTANCE.  MODERATE HIATAL HERNIA.   STOMACH: RARE GASTRIC POLYP IN THE GASTRIC BODY AND MILD ERYTHEMA INTHE ANTRUM BIOPSIED VIA COLD FORCEPS. DUODENUM: The duodenal mucosa showed no abnormalities in the bulb and 2nd part of the duodenum. COMPLICATIONS: There were no immediate complications.  ENDOSCOPIC IMPRESSION: 1.   Esophagitis with stricture 25-30 cm from the teeth. 2.   RARE GASTRIC POLYP IN THE GASTRIC BODY AND MILD GASTRITIS 3.   MODERATE HIATAL  HERNIA  RECOMMENDATIONS: OMEPRAZOLE 30 MINUTES PRIOR TO HER MEALS TWICE DAILY FOREVER. FOLLOW A LOW FAT DIET. AWAIT BIOPSY. FOLLOW UP IN 3 MOS.  REPEAT EXAM: _______________________________ eSignedJonette Eva:  Elizar Alpern, MD 11/11/2014 3:18 PM    CPT CODES: ICD CODES:  The ICD and CPT codes recommended by this software are interpretations from the data that the clinical staff has captured with the software.  The verification of the translation of this report to the ICD and CPT codes and modifiers is the sole responsibility of the health care institution and practicing physician where this report was generated.  [employer] will not be held responsible for the validity of the ICD and CPT codes included on this report.  AMA assumes no liability for data contained or not contained herein. CPT is a Publishing rights managerregistered trademark of the [employer].

## 2014-11-17 ENCOUNTER — Encounter (HOSPITAL_COMMUNITY): Payer: Self-pay | Admitting: Gastroenterology

## 2014-12-08 ENCOUNTER — Telehealth: Payer: Self-pay | Admitting: Gastroenterology

## 2014-12-08 NOTE — Telephone Encounter (Signed)
Please call pt. SHE HAD AN ULCER IN HER ESOPHAGUS DUE TO UNCONTROLLED ACID REFLUX. She had a polypoid lesion IN HER STOMACH removed and it IS benign.    SHE NEEDS OMEPRAZOLE.  SHE SHOULD TAKE IT 30 MINUTES PRIOR TO HER MEALS TWICE DAILY FOREVER. FOLLOW A LOW FAT DIET. FOOD SHOULD BE BAKED, BOILED, OR BROILED. FOLLOW UP IN E30 GERD, DYSPHAGIA.

## 2014-12-09 NOTE — Telephone Encounter (Signed)
Pt's husband aware of results

## 2014-12-11 ENCOUNTER — Encounter: Payer: Self-pay | Admitting: Gastroenterology

## 2014-12-11 NOTE — Telephone Encounter (Signed)
APPOINTMENT MADE AND LETTER SENT °

## 2015-01-27 ENCOUNTER — Encounter: Payer: Self-pay | Admitting: Gastroenterology

## 2015-03-11 ENCOUNTER — Ambulatory Visit: Payer: Medicare Other | Admitting: Gastroenterology

## 2015-03-16 ENCOUNTER — Encounter: Payer: Self-pay | Admitting: Gastroenterology

## 2015-03-16 ENCOUNTER — Telehealth: Payer: Self-pay | Admitting: Gastroenterology

## 2015-03-16 ENCOUNTER — Ambulatory Visit: Payer: Medicare Other | Admitting: Gastroenterology

## 2015-03-16 NOTE — Telephone Encounter (Signed)
PATIENT WAS A NO SHOW 03/16/15 AND LETTER WAS SENT  °

## 2016-12-13 ENCOUNTER — Encounter (HOSPITAL_COMMUNITY): Payer: Self-pay

## 2016-12-13 ENCOUNTER — Emergency Department (HOSPITAL_COMMUNITY): Payer: Medicare Other

## 2016-12-13 ENCOUNTER — Emergency Department (HOSPITAL_COMMUNITY)
Admission: EM | Admit: 2016-12-13 | Discharge: 2016-12-14 | Disposition: A | Discharge status: Home / Self Care | Payer: Medicare Other | Attending: Emergency Medicine | Admitting: Emergency Medicine

## 2016-12-13 DIAGNOSIS — W182XXA Fall in (into) shower or empty bathtub, initial encounter: Secondary | ICD-10-CM | POA: Diagnosis not present

## 2016-12-13 DIAGNOSIS — S0990XA Unspecified injury of head, initial encounter: Secondary | ICD-10-CM | POA: Diagnosis present

## 2016-12-13 DIAGNOSIS — Y999 Unspecified external cause status: Secondary | ICD-10-CM | POA: Diagnosis not present

## 2016-12-13 DIAGNOSIS — W19XXXA Unspecified fall, initial encounter: Secondary | ICD-10-CM

## 2016-12-13 DIAGNOSIS — Y93E1 Activity, personal bathing and showering: Secondary | ICD-10-CM | POA: Insufficient documentation

## 2016-12-13 DIAGNOSIS — S0101XA Laceration without foreign body of scalp, initial encounter: Secondary | ICD-10-CM | POA: Diagnosis not present

## 2016-12-13 DIAGNOSIS — Y929 Unspecified place or not applicable: Secondary | ICD-10-CM | POA: Insufficient documentation

## 2016-12-13 MED ORDER — ACETAMINOPHEN 500 MG PO TABS
1000.0000 mg | ORAL_TABLET | Freq: Once | ORAL | Status: AC
  Administered 2016-12-14: 1000 mg via ORAL
  Filled 2016-12-13: qty 2

## 2016-12-13 MED ORDER — ONDANSETRON HCL 4 MG/5ML PO SOLN: 4.0000 mg | Freq: Once | ORAL | Status: DC

## 2016-12-13 MED ORDER — ONDANSETRON 4 MG PO TBDP
4.0000 mg | ORAL_TABLET | Freq: Once | ORAL | Status: AC
  Administered 2016-12-13: 4 mg via ORAL

## 2016-12-13 NOTE — Discharge Instructions (Signed)
Staple out in approximately 8 days. Mrs. Anna Moon can shower. There may be a small amount of bleeding.

## 2016-12-13 NOTE — ED Provider Notes (Signed)
[facility]-EM[facility]Y DEPT Provider Note   CSN: [id number] Arrival date & time: 12/13/16  2127   By signing my name below, I, [name], attest that this documentation has been prepared under the direction and in the presence of Donnetta Hutching, MD. Electronically Signed: [name], Scribe. 12/13/16. 10:21 PM. History   Chief Complaint Chief Complaint  Patient presents with  . Fall   LEVEL 5 CAVEAT: Dementia. The history is provided by a relative. No language interpreter was used.  HPI Comments: Anna Moon is a 71 y.o. female who presents to the Emergency Department complaining of a head laceration s/p fall that occurred around 8:15 pm. Per family. The patient was taking a shower at that time and fell and hit her head on the sharp corner of the shower door. She has been continuously vomiting since this incident. They report 7-8 episodes of vomiting. They believe that the patient is more fatigued than usual. The patient has a hx of dementia.  Past Medical History:  Diagnosis Date  . Depression   . Mild dementia    ? Alzheimer's    Patient Active Problem List   Diagnosis Date Noted  . Hepatomegaly 07/26/2012  . Dysphagia 07/26/2012    Past Surgical History:  Procedure Laterality Date  . ESOPHAGOGASTRODUODENOSCOPY  08/10/2012   WUJ:WJXBJYNWG was found in the upper third of the esophagus Non-erosive gastritis (inflammation) was found; multiple bx Duodenal inflammation was found. minimal gastritis. no H.pylori  . ESOPHAGOGASTRODUODENOSCOPY N/A 11/10/2014   NFA:OZHYQMVHQ 25-30 cm from the teeth/rare gastri poylpf/moderate HH  . MALONEY DILATION  08/10/2012   Procedure: MALONEY DILATION;  Surgeon: West Bali, MD;  Location: [facility] ENDO SUITE;  Service: Endoscopy;  Laterality: N/A;  . MALONEY DILATION N/A 11/10/2014   Procedure: Elease Hashimoto DILATION;  Surgeon: West Bali, MD;  Location: [facility] ENDO SUITE;  Service: Endoscopy;  Laterality: N/A;  . None    . SAVORY DILATION  08/10/2012     Procedure: SAVORY DILATION;  Surgeon: West Bali, MD;  Location: [facility] SUITE;  Service: Endoscopy;  Laterality: N/A;  . SAVORY DILATION N/A 11/10/2014   Procedure: SAVORY DILATION;  Surgeon: West Bali, MD;  Location: [facility] ENDO SUITE;  Service: Endoscopy;  Laterality: N/A;    OB History    No data available       Home Medications    Prior to Admission medications   Not on File    Family History Family History  Problem Relation Age of Onset  . Colon cancer Neg Hx     Social History Social History  Substance Use Topics  . Smoking status: Never Smoker  . Smokeless tobacco: Never Used  . Alcohol use No     Allergies   Patient has no known allergies.   Review of Systems Review of Systems  Unable to perform ROS: Dementia    Physical Exam Updated Vital Signs BP (!) 157/103 (BP Location: Left Arm)   Pulse 67   Temp (!) 96.6 F (35.9 C) (Temporal)   Resp 18   Ht 5\' 4"  (1.626 m)   Wt 140 lb (63.5 kg)   SpO2 100%   BMI 24.03 kg/m   Physical Exam  Constitutional: She is oriented to person, place, and time.  Appears demented, but no vomiting noted.  HENT:  2 cm laceration on her left occipital area.   Eyes: Conjunctivae are normal.  Neck: Neck supple.  Cardiovascular: Normal rate and regular rhythm.   Pulmonary/Chest: Effort normal and  breath sounds normal.  Abdominal: Soft. Bowel sounds are normal.  Musculoskeletal: Normal range of motion.  Neurological: She is alert and oriented to person, place, and time.  Skin: Skin is warm and dry.  Psychiatric: She has a normal mood and affect. Her behavior is normal.  Nursing note and vitals reviewed.    ED Treatments / Results  DIAGNOSTIC STUDIES: Oxygen Saturation is 100% on RA, normal by my interpretation.    COORDINATION OF CARE: 10:10 PM Discussed treatment plan with family at bedside and they agreed to plan. I will order a CT head and will give the patient Zofran. I will also stable the patients 2  cm laceration on her left occipital area.  Labs (all labs ordered are listed, but only abnormal results are displayed) Labs Reviewed - No data to display  EKG  EKG Interpretation None       Radiology Ct Head Wo Contrast  Result Date: 12/13/2016 CLINICAL DATA:  Fall at 8:15 p.m. Left occipital head laceration. Vomiting since the episode. Fatigue and dementia. EXAM: CT HEAD WITHOUT CONTRAST TECHNIQUE: Contiguous axial images were obtained from the base of the skull through the vertex without intravenous contrast. COMPARISON:  01/27/2010 FINDINGS: Brain: Diffuse cerebral atrophy. Ventricular dilatation consistent with central atrophy. Low-attenuation changes in the deep white matter consistent with small vessel ischemia. No mass effect or midline shift. No abnormal extra-axial fluid collections. Gray-white matter junctions are distinct. Basal cisterns are not effaced. No acute intracranial hemorrhage. Vascular: No hyperdense vessel or unexpected calcification. Skull: Normal. Negative for fracture or focal lesion. Sinuses/Orbits: No acute finding. Other: Small subcutaneous scalp hematoma and laceration over the left posterior parietal region. IMPRESSION: No acute intracranial abnormalities. Chronic atrophy and small vessel ischemic changes. Electronically Signed   By: Burman NievesWilliam  Stevens M.D.   On: 12/13/2016 23:10    Procedures .Marland Kitchen.Laceration Repair Date/Time: 12/13/2016 11:46 PM Performed by: Donnetta HutchingOOK, Denetra Formoso Authorized by: Donnetta HutchingOOK, Shauntell Iglesia   Consent:    Consent obtained:  Verbal and emergent situation   Consent given by:  Spouse   Risks discussed:  Pain Comments:     2325:   Wound cleaned with normal saline. Hair trimmed. Staples 3. Patient tolerated procedure well.   (including critical care time)    Medications Ordered in ED Medications  acetaminophen (TYLENOL) tablet 1,000 mg (not administered)  ondansetron (ZOFRAN-ODT) disintegrating tablet 4 mg (4 mg Oral Given 12/13/16 2234)     Initial  Impression / Assessment and Plan / ED Course  I have reviewed the triage vital signs and the nursing notes.  Pertinent labs & imaging results that were available during my care of the patient were reviewed by me and considered in my medical decision making (see chart for details).     CT head negative for a subdural hematoma. Wound laceration was repaired with staples. Discussed all clinical findings with husband and daughter-in-law.  Final Clinical Impressions(s) / ED Diagnoses   Final diagnoses:  Fall, initial encounter  Laceration of scalp, initial encounter    New Prescriptions New Prescriptions   No medications on file   I personally performed the services described in this documentation, which was scribed in my presence. The recorded information has been reviewed and is accurate.     Donnetta HutchingBrian Lessa Huge, MD 12/13/16 [phone]

## 2016-12-13 NOTE — ED Triage Notes (Signed)
Patient had an unwitnessed fall today and hit her head.  Fell in the bathroom and hit her head over the shower door.  She was awake when I found her, but I could not get her to speak or do anything per her husband.  She has vomited 7-8 times since she hit her head.  Complaining of a headache.

## 2017-05-10 IMAGING — CT CT HEAD W/O CM
4 series · 16 of 47 positions shown, 18 images · non-contrast
Comparison: 01/27/2010

CLINICAL DATA: Fall at [DATE] p.m.. Left occipital head laceration.
Vomiting since the episode. Fatigue and dementia.

EXAM:
CT HEAD WITHOUT CONTRAST
TECHNIQUE: Contiguous axial images were obtained from the base of the skull
through the vertex without intravenous contrast.

[Series 2: head trauma wo · axial · 0.44mm/px · z∈[+1658,+1778]mm · 7 of 34 slices shown, 9 images]
[im 5/34  brain]
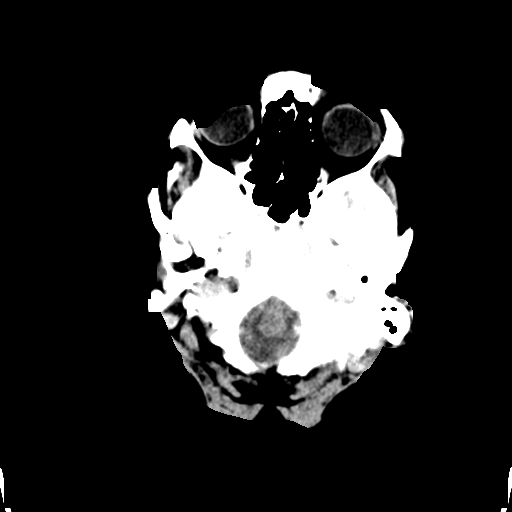
[im 5/34  bone]
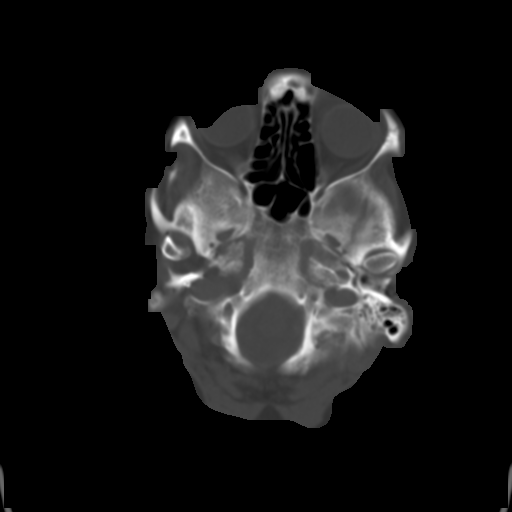
[im 9/34  brain]
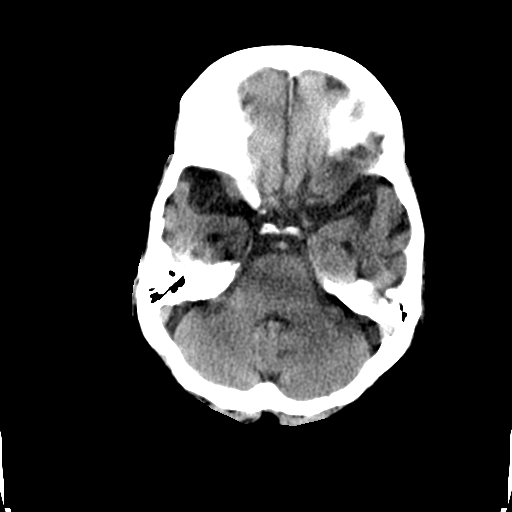
[im 13/34  brain]
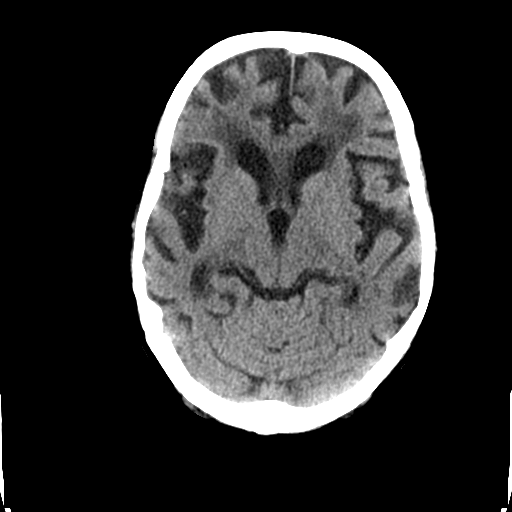
[im 17/34  brain]
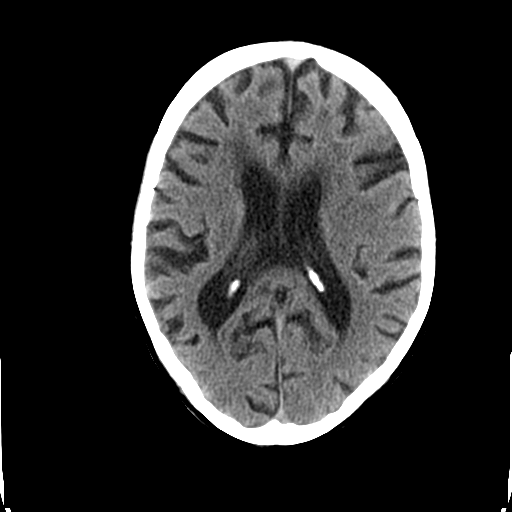
[im 21/34  brain]
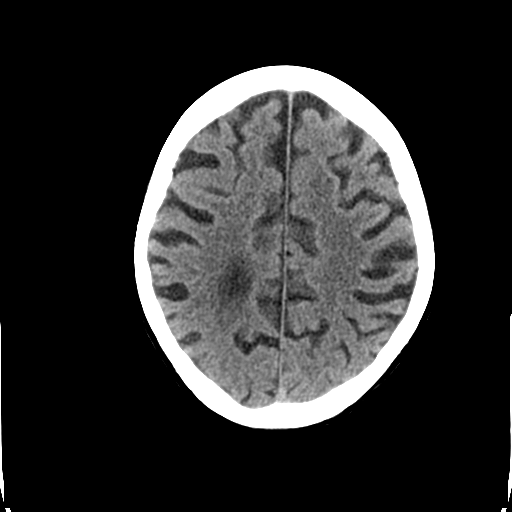
[im 21/34  bone]
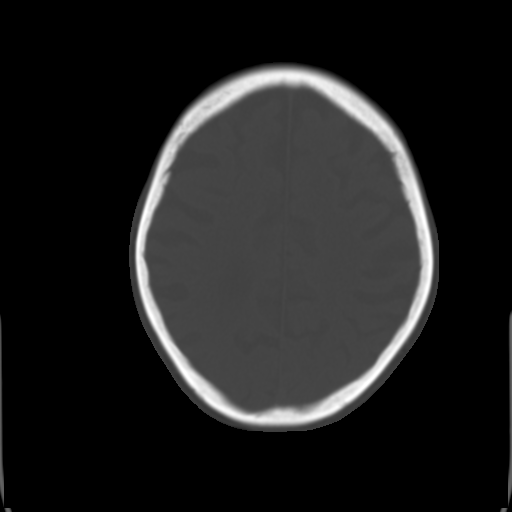
[im 25/34  brain]
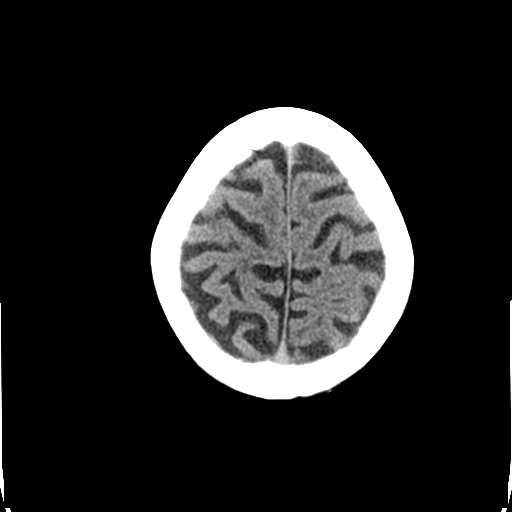
[im 29/34  brain]
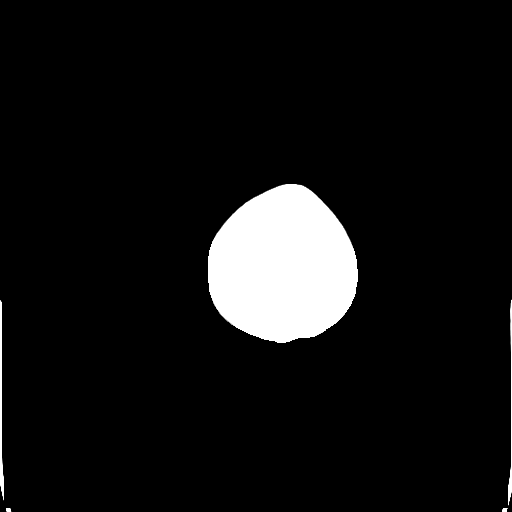

[Series 3: head bone · axial · 0.44mm/px · z∈[+1654,+1688]mm · 3 of 85 slices shown]
[im 9/85  bone]
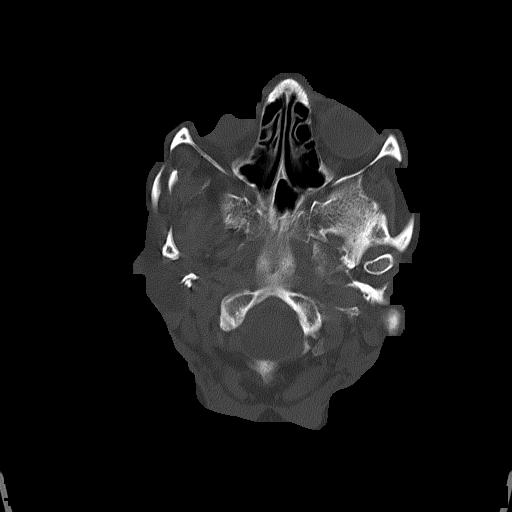
[im 17/85  bone]
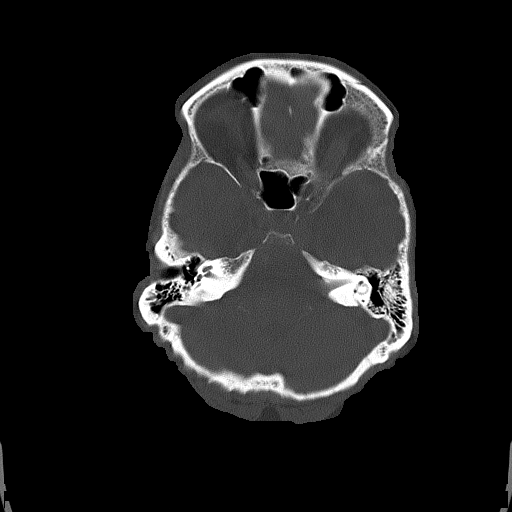
[im 26/85  bone]
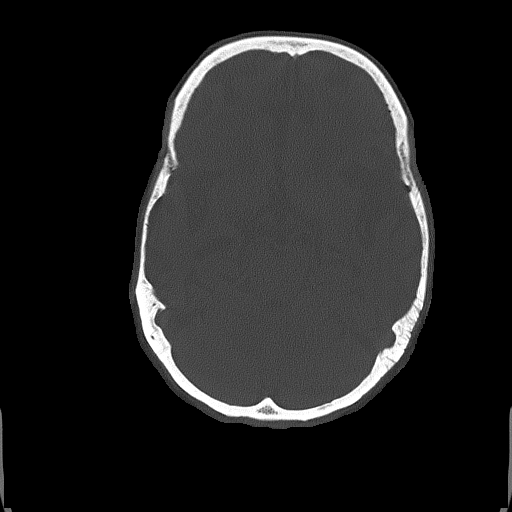

[Series 4: coronal soft tissue · coronal · 0.30mm/px · 3 of 64 slices shown]
[im 22/64  brain]
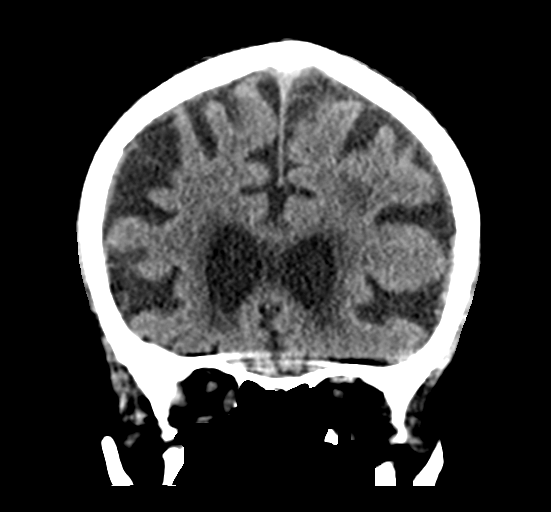
[im 29/64  brain]
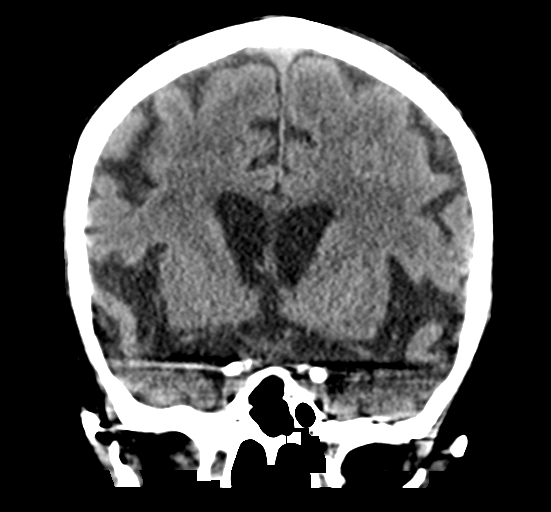
[im 36/64  brain]
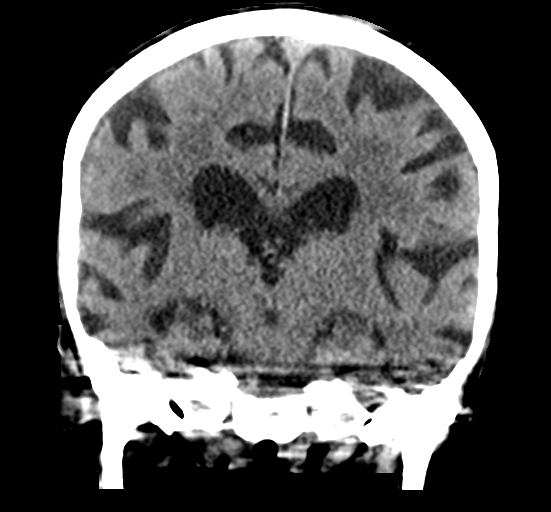

[Series 5: sagittal soft tissue · sagittal · 0.32mm/px · 3 of 49 slices shown]
[im 17/49  brain]
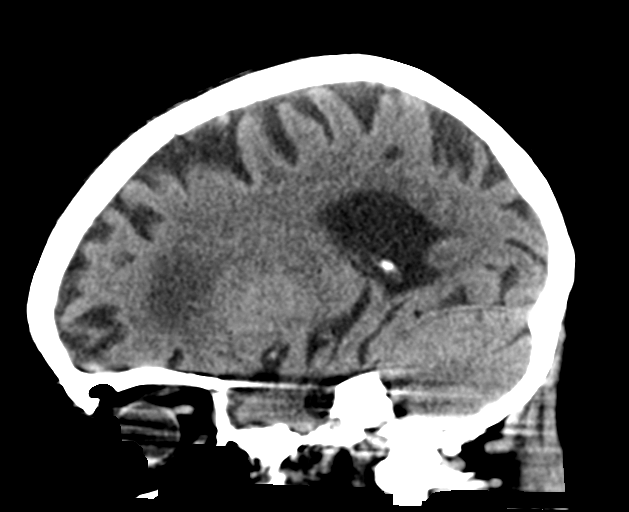
[im 25/49  brain]
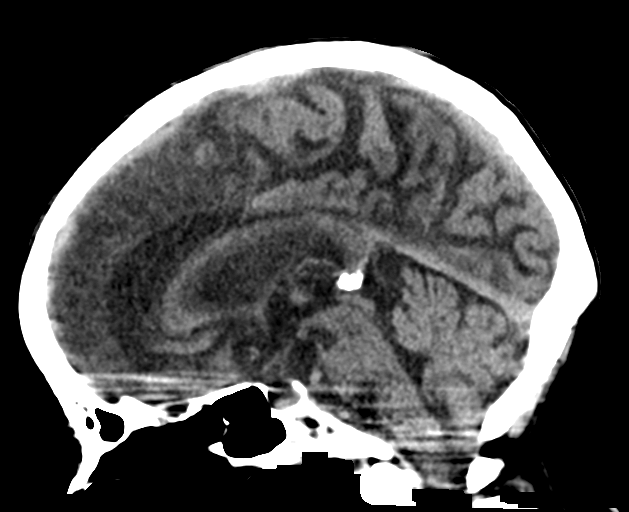
[im 33/49  brain]
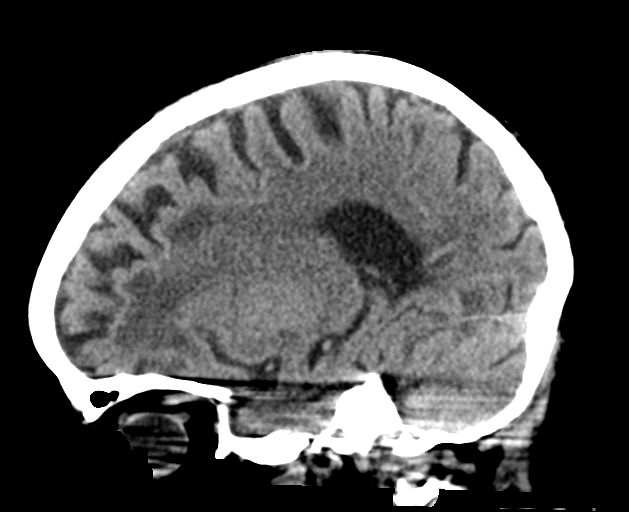

[16 of 47 positions shown; findings below may reference images not displayed]

FINDINGS: Brain: Diffuse cerebral atrophy. Ventricular dilatation consistent
with central atrophy. Low-attenuation changes in the deep white
matter consistent with small vessel ischemia. No mass effect or
midline shift. No abnormal extra-axial fluid collections. Gray-white
matter junctions are distinct. Basal cisterns are not effaced. No
acute intracranial hemorrhage.

Vascular: No hyperdense vessel or unexpected calcification.

Skull: Normal. Negative for fracture or focal lesion.

Sinuses/Orbits: No acute finding.

Other: Small subcutaneous scalp hematoma and laceration over the
left posterior parietal region.
IMPRESSION: No acute intracranial abnormalities. Chronic atrophy and small
vessel ischemic changes.

## 2019-01-10 ENCOUNTER — Telehealth: Payer: Self-pay | Admitting: Nurse Practitioner

## 2019-01-10 NOTE — Telephone Encounter (Signed)
I called and left a message to return call to schedule a follow-up in-home PC visit

## 2020-04-07 DEATH — deceased
